# Patient Record
Sex: Female | Born: 1980 | Race: Black or African American | Hispanic: No | Marital: Married
Health system: Southern US, Community
[De-identification: ages and names within clinical notes are randomized; demographics above are authoritative.]

---

## 2005-12-18 ENCOUNTER — Ambulatory Visit: Payer: Self-pay | Admitting: Gynecology

## 2006-01-12 ENCOUNTER — Observation Stay: Payer: Self-pay | Admitting: Obstetrics and Gynecology

## 2006-11-21 ENCOUNTER — Ambulatory Visit: Payer: Self-pay | Admitting: Gynecology

## 2007-05-24 ENCOUNTER — Emergency Department: Payer: Self-pay | Admitting: Emergency Medicine

## 2007-05-27 ENCOUNTER — Ambulatory Visit: Payer: Self-pay | Admitting: Emergency Medicine

## 2008-01-29 ENCOUNTER — Inpatient Hospital Stay: Payer: Self-pay | Admitting: Obstetrics and Gynecology

## 2009-09-06 ENCOUNTER — Ambulatory Visit: Payer: Self-pay | Admitting: Surgery

## 2012-01-15 ENCOUNTER — Ambulatory Visit: Payer: Self-pay | Admitting: Obstetrics and Gynecology

## 2012-01-16 ENCOUNTER — Ambulatory Visit: Payer: Self-pay | Admitting: Obstetrics and Gynecology

## 2012-01-17 ENCOUNTER — Ambulatory Visit: Payer: Self-pay | Admitting: Obstetrics and Gynecology

## 2012-01-23 LAB — PATHOLOGY REPORT

## 2014-01-19 ENCOUNTER — Ambulatory Visit: Payer: Self-pay | Admitting: Obstetrics and Gynecology

## 2014-01-20 ENCOUNTER — Ambulatory Visit: Payer: Self-pay | Admitting: Obstetrics and Gynecology

## 2014-09-08 NOTE — Op Note (Signed)
PATIENT NAME:  Lindsay FriendHESTER, [name] MR#:  [id number] DATE OF BIRTH:  10-07-1980  DATE OF PROCEDURE:  01/17/2012  PREOPERATIVE DIAGNOSIS: Blighted ovum.   POSTOPERATIVE DIAGNOSIS: Blighted ovum.    PROCEDURE: Suction dilation and curettage.   SURGEON: Suzy Bouchardhomas J. Schermerhorn, MD  ANESTHESIA: [facility].   INDICATIONS: [age] year old gravida 3, para 1 patient at nine weeks estimated gestational age, rising quantitative hCG, last level of 17,208. Ultrasound day before the procedure shows gestational sac, no yolk sac, no fetal pole. Patient without pelvic pain.   DESCRIPTION OF PROCEDURE: After adequate MAC anesthesia patient was placed in dorsal supine position, legs in the candy cane stirrups. Lower abdomen, perineal and vagina were prepped with Betadine. Patient's bladder was catheterized yielding 50 mL clear urine. Weighted speculum was placed in the posterior vaginal vault and the anterior cervix grasped with a single-tooth tenaculum. Examination revealed uterus to be anteverted approximately eight weeks in size. The cervix was dilated to #20 Hanks dilator without difficulty. A #8 flexible suction curette placed in the endometrial cavity. Suction curettage performed with tissue consistent with products of conception. Sharp curettage performed with good uterine cry throughout and a repeat suction curetting yielded no additional tissue and no additional clots. Good hemostasis noted. Silver nitrate applied to the anterior cervix to control bleeding at the [place] sites. Bimanual exam did reveal nodularity of the posterior cul-de-sac consistent with her history of endometriosis. There were no complications. Patient tolerated procedure well. Estimated blood loss minimal. Intraoperative fluids 400 mL. Patient tolerated procedure well, was taken to recovery room good condition.  ____________________________ Suzy Bouchardhomas J. Schermerhorn, MD tjs:cms D: 01/17/2012 09:39:45 ET T: 01/17/2012 11:40:00  ET  JOB#: [id number] cc: Suzy Bouchardhomas J. Schermerhorn, MD, <Dictator>  Suzy BouchardHOMAS J SCHERMERHORN MD ELECTRONICALLY SIGNED 01/18/2012 11:27

## 2018-10-28 ENCOUNTER — Telehealth: Payer: Self-pay

## 2018-10-28 DIAGNOSIS — Z20822 Contact with and (suspected) exposure to covid-19: Secondary | ICD-10-CM

## 2018-10-28 NOTE — Telephone Encounter (Signed)
[facility] Request COVID 19 test. 

## 2018-10-29 ENCOUNTER — Other Ambulatory Visit: Payer: Self-pay

## 2018-10-29 DIAGNOSIS — Z20822 Contact with and (suspected) exposure to covid-19: Secondary | ICD-10-CM

## 2018-11-01 LAB — NOVEL CORONAVIRUS, NAA: SARS-CoV-2, NAA: NOT DETECTED

## 2018-12-23 ENCOUNTER — Ambulatory Visit
Admission: RE | Admit: 2018-12-23 | Discharge: 2018-12-23 | Disposition: A | Discharge status: Home / Self Care | Payer: Managed Care, Other (non HMO) | Source: Ambulatory Visit | Attending: Medical Oncology | Admitting: Medical Oncology

## 2018-12-23 ENCOUNTER — Other Ambulatory Visit: Payer: Self-pay | Admitting: Medical Oncology

## 2018-12-23 ENCOUNTER — Other Ambulatory Visit: Payer: Self-pay

## 2018-12-23 DIAGNOSIS — R1032 Left lower quadrant pain: Secondary | ICD-10-CM | POA: Diagnosis present

## 2018-12-23 MED ORDER — IOHEXOL 300 MG/ML  SOLN
100.0000 mL | Freq: Once | INTRAMUSCULAR | Status: AC | PRN
  Administered 2018-12-23: 100 mL via INTRAVENOUS

## 2019-11-25 IMAGING — CT CT ABDOMEN AND PELVIS WITH CONTRAST
1 of 4 series · 14 of 32 positions shown, 19 images · IV contrast (APPLIED)
Comparison: The patient's prior CT abdomen pelvis from July 17, 2002 is not available on PACs for comparison.

CLINICAL DATA: Left lower quadrant abdomen pain.

EXAM:
CT ABDOMEN AND PELVIS WITH CONTRAST
TECHNIQUE: Multidetector CT imaging of the abdomen and pelvis was performed
using the standard protocol following bolus administration of
intravenous contrast.
CONTRAST:  100mL OMNIPAQUE IOHEXOL 300 MG/ML  SOLN

[Series 2: axial st · axial · 0.65mm/px · z∈[-474,-74]mm · 14 of 92 slices shown, 19 images]
[im 6/92  soft-tissue]
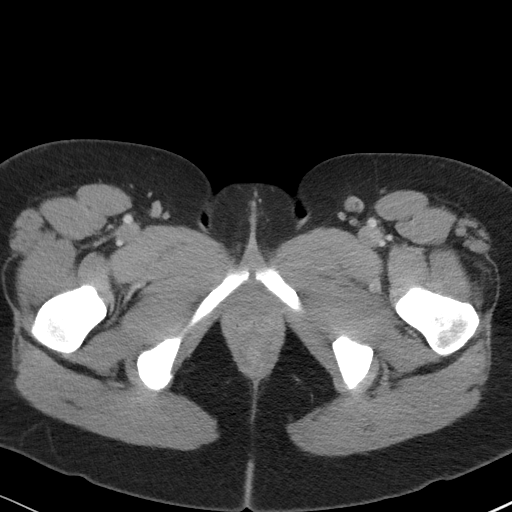
[im 6/92  bone]
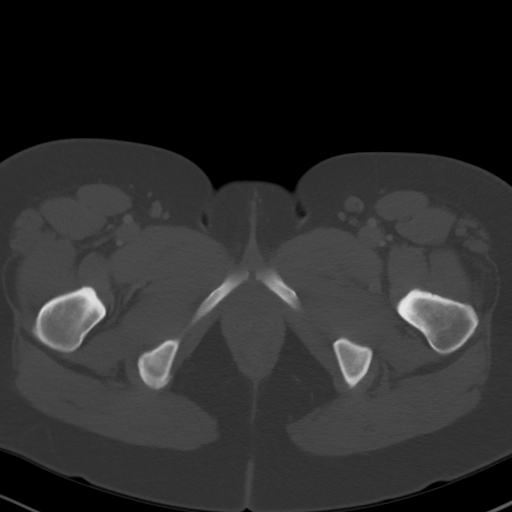
[im 11/92  soft-tissue]
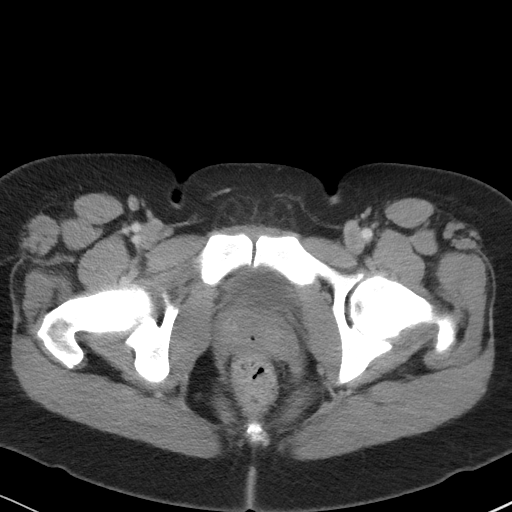
[im 22/92  soft-tissue]
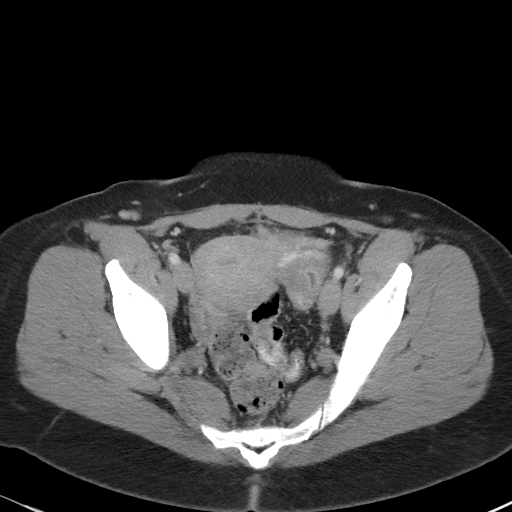
[im 27/92  soft-tissue]
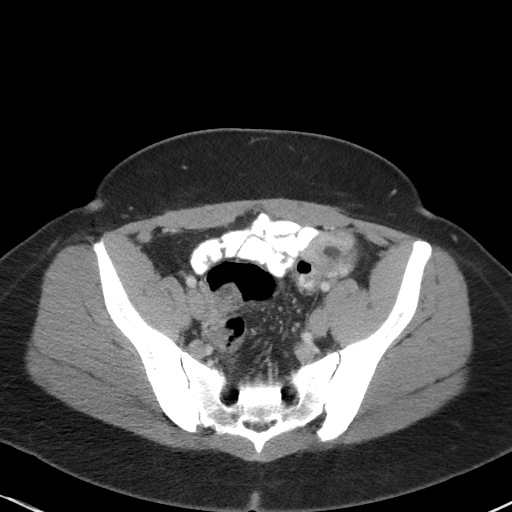
[im 33/92  soft-tissue]
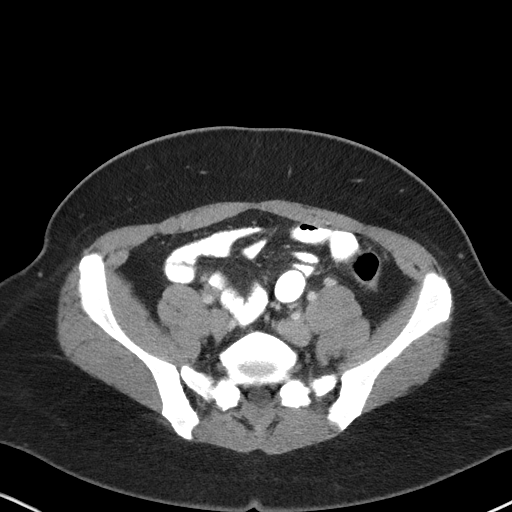
[im 38/92  soft-tissue]
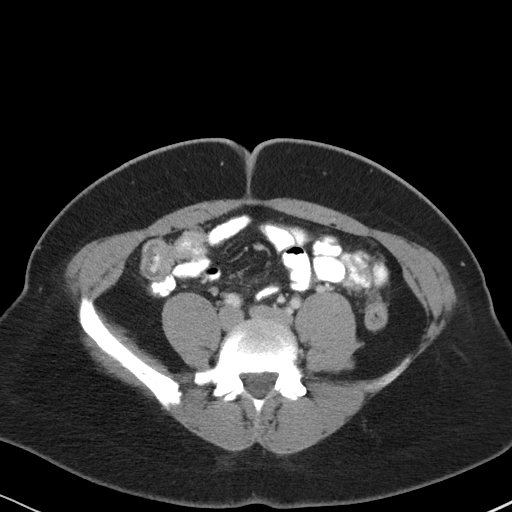
[im 49/92  soft-tissue]
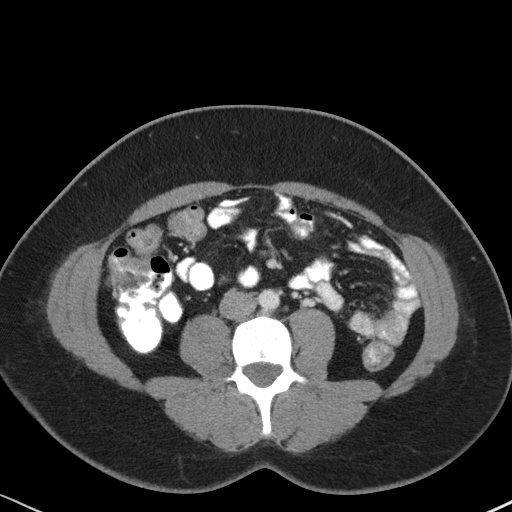
[im 54/92  soft-tissue]
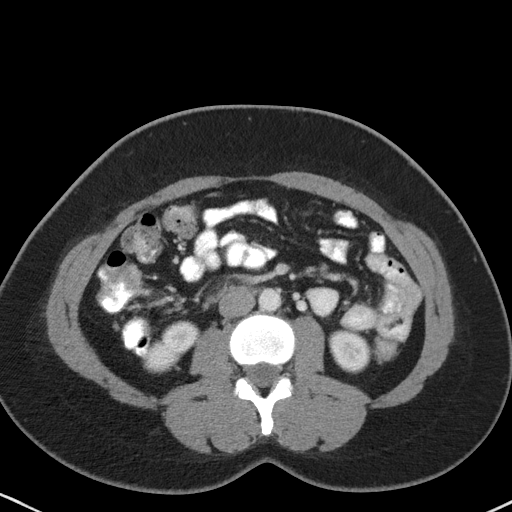
[im 59/92  soft-tissue]
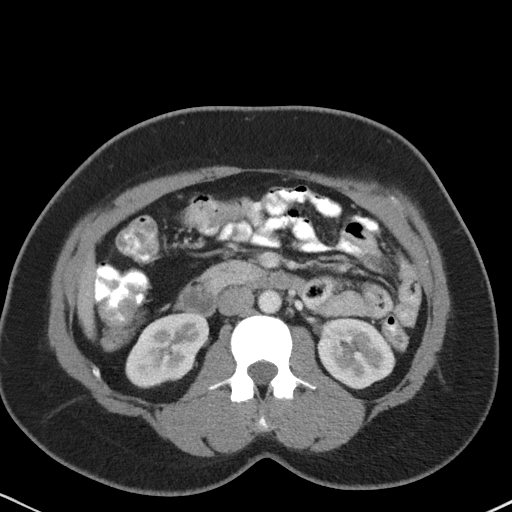
[im 59/92  bone]
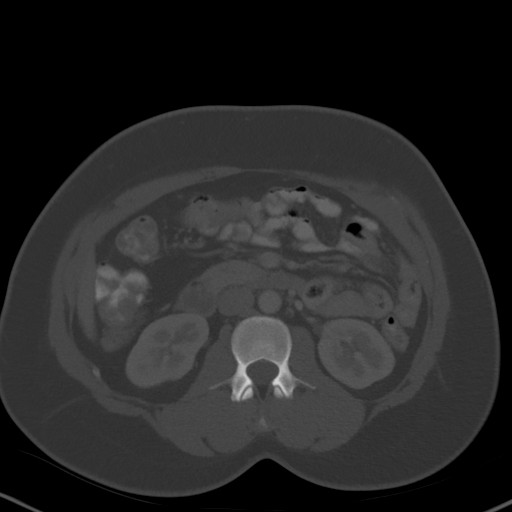
[im 65/92  soft-tissue]
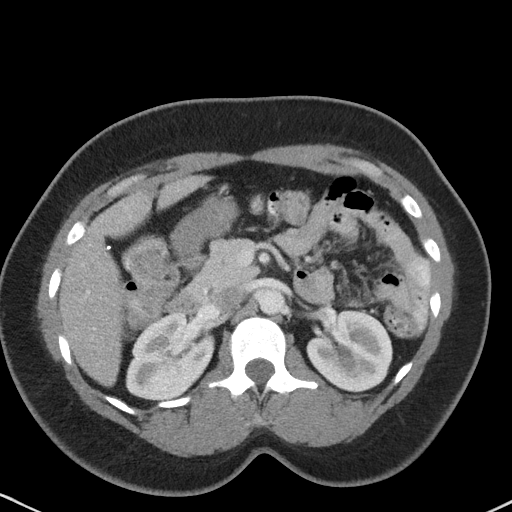
[im 70/92  soft-tissue]
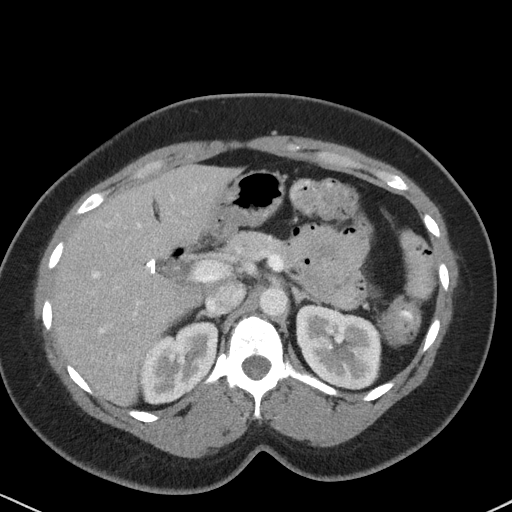
[im 70/92  lung]
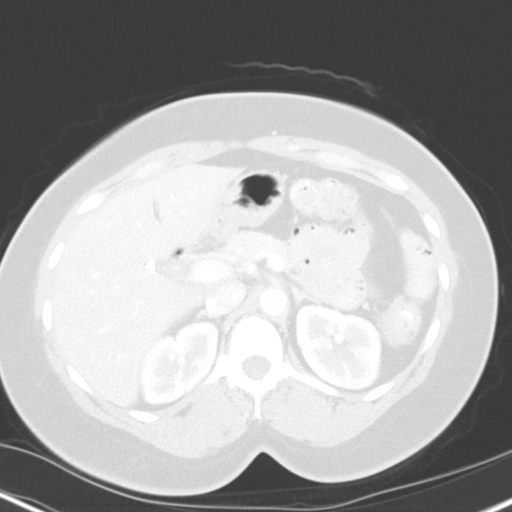
[im 75/92  lung]
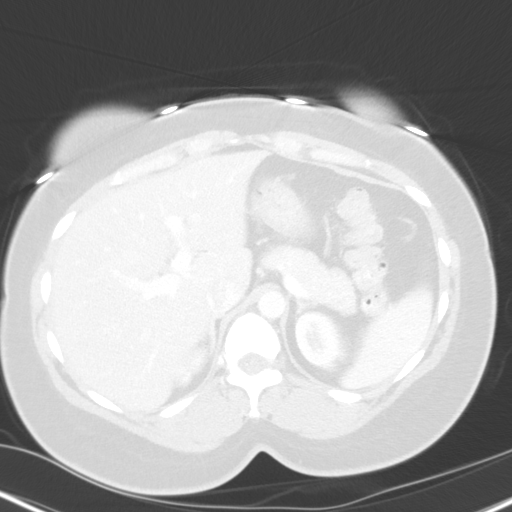
[im 81/92  soft-tissue]
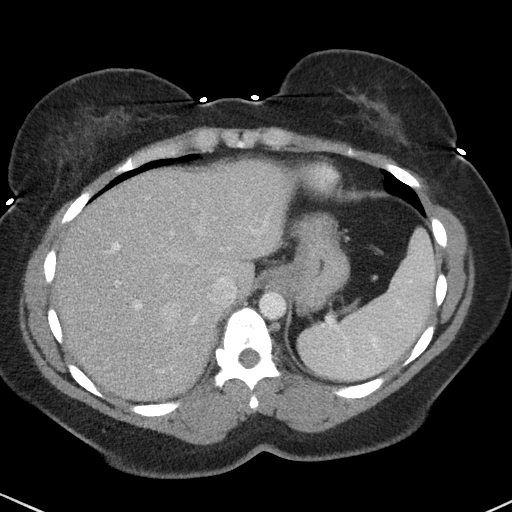
[im 81/92  lung]
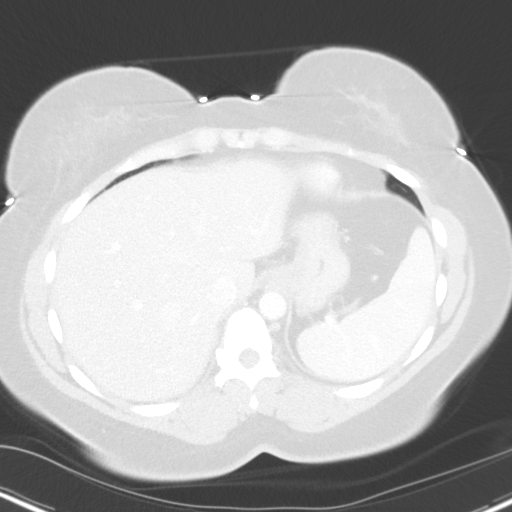
[im 86/92  soft-tissue]
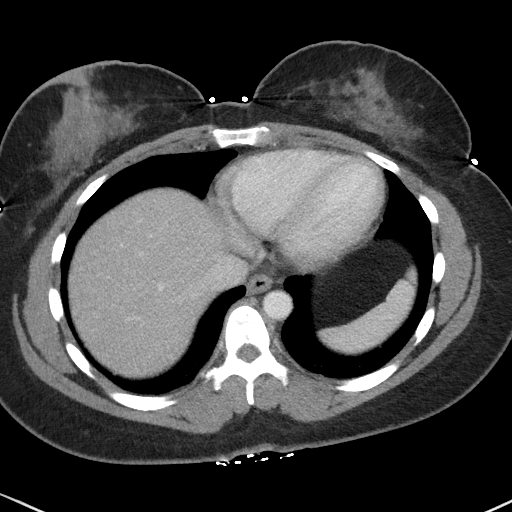
[im 86/92  lung]
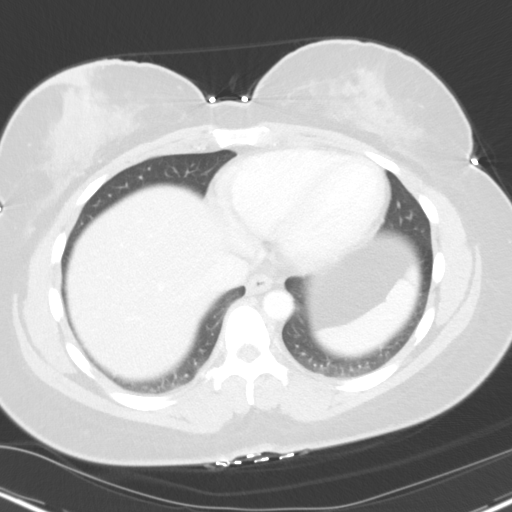

[14 of 32 positions shown; findings below may reference images not displayed]

FINDINGS: Lower chest: No acute abnormality.

Hepatobiliary: Mild diffuse low density of the liver is identified.
Patient status post prior cholecystectomy. The biliary tree is
normal.

Pancreas: Unremarkable. No pancreatic ductal dilatation or
surrounding inflammatory changes.

Spleen: Normal in size without focal abnormality.

Adrenals/Urinary Tract: The bilateral adrenal glands are normal. The
kidneys are normal. There is no hydronephrosis bilaterally. The
bladder is normal.

Stomach/Bowel: In the anterior left lower quadrant, there is an area
inflammation and fluid with central fat adjacent to the descending
colon/sigmoid colon junction. Favor epiploic appendagitis. There is
no bowel obstruction. The appendix is not seen but no inflammation
is noted around cecum. The stomach is normal.

Vascular/Lymphatic: No significant vascular findings are present. No
enlarged abdominal or pelvic lymph nodes.

Reproductive: Uterus is unremarkable. Prominent gonadal veins
identified.

Other: None.

Musculoskeletal: No acute abnormality.
IMPRESSION: Changes in the anterior left lower quadrant as described. Favor
epiploic appendagitis. Recommend follow-up CT scan in 1-2 months to
confirm the findings resolve.
